# Patient Record
Sex: Female | Born: 1960 | Race: White | Hispanic: No | Marital: Single | Smoking: Current every day smoker
Health system: Southern US, Community
[De-identification: ages and names within clinical notes are randomized; demographics above are authoritative.]

---

## 2006-11-12 ENCOUNTER — Ambulatory Visit: Payer: Self-pay | Admitting: Family Medicine

## 2013-02-24 ENCOUNTER — Ambulatory Visit: Payer: Self-pay | Admitting: Family Medicine

## 2013-10-02 ENCOUNTER — Ambulatory Visit: Payer: Self-pay | Admitting: Orthopedic Surgery

## 2013-10-02 ENCOUNTER — Emergency Department: Payer: Self-pay | Admitting: Emergency Medicine
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
## 2014-12-22 ENCOUNTER — Other Ambulatory Visit: Payer: Self-pay | Admitting: Family Medicine

## 2014-12-22 DIAGNOSIS — Z1231 Encounter for screening mammogram for malignant neoplasm of breast: Secondary | ICD-10-CM

## 2015-01-01 ENCOUNTER — Ambulatory Visit: Payer: Self-pay

## 2016-06-13 ENCOUNTER — Other Ambulatory Visit: Payer: Self-pay | Admitting: Family Medicine

## 2016-06-13 DIAGNOSIS — Z1231 Encounter for screening mammogram for malignant neoplasm of breast: Secondary | ICD-10-CM

## 2016-07-08 ENCOUNTER — Ambulatory Visit: Payer: Self-pay | Attending: Family Medicine

## 2017-10-06 ENCOUNTER — Other Ambulatory Visit: Payer: Self-pay | Admitting: Family Medicine

## 2017-10-06 DIAGNOSIS — Z1231 Encounter for screening mammogram for malignant neoplasm of breast: Secondary | ICD-10-CM

## 2017-10-23 ENCOUNTER — Ambulatory Visit
Admission: RE | Admit: 2017-10-23 | Discharge: 2017-10-23 | Disposition: A | Discharge status: Home / Self Care | Payer: BLUE CROSS/BLUE SHIELD | Source: Ambulatory Visit | Attending: Family Medicine | Admitting: Family Medicine

## 2017-10-23 DIAGNOSIS — Z1231 Encounter for screening mammogram for malignant neoplasm of breast: Secondary | ICD-10-CM | POA: Diagnosis not present

## 2018-12-29 ENCOUNTER — Other Ambulatory Visit: Payer: Self-pay | Admitting: Family Medicine

## 2018-12-29 DIAGNOSIS — Z1231 Encounter for screening mammogram for malignant neoplasm of breast: Secondary | ICD-10-CM

## 2019-10-17 ENCOUNTER — Ambulatory Visit
Admission: RE | Admit: 2019-10-17 | Discharge: 2019-10-17 | Disposition: A | Discharge status: Home / Self Care | Payer: BC Managed Care – PPO | Source: Ambulatory Visit | Attending: Family Medicine | Admitting: Family Medicine

## 2019-10-17 ENCOUNTER — Other Ambulatory Visit: Payer: Self-pay

## 2019-10-17 DIAGNOSIS — Z1231 Encounter for screening mammogram for malignant neoplasm of breast: Secondary | ICD-10-CM | POA: Insufficient documentation

## 2021-07-29 ENCOUNTER — Other Ambulatory Visit: Payer: Self-pay | Admitting: Orthopedic Surgery

## 2021-07-29 DIAGNOSIS — G8929 Other chronic pain: Secondary | ICD-10-CM

## 2021-07-29 DIAGNOSIS — M5136 Other intervertebral disc degeneration, lumbar region: Secondary | ICD-10-CM

## 2021-08-12 ENCOUNTER — Ambulatory Visit
Admission: RE | Admit: 2021-08-12 | Discharge: 2021-08-12 | Disposition: A | Discharge status: Home / Self Care | Payer: PRIVATE HEALTH INSURANCE | Source: Ambulatory Visit | Attending: Orthopedic Surgery | Admitting: Orthopedic Surgery

## 2021-08-12 DIAGNOSIS — M5441 Lumbago with sciatica, right side: Secondary | ICD-10-CM | POA: Insufficient documentation

## 2021-08-12 DIAGNOSIS — G8929 Other chronic pain: Secondary | ICD-10-CM | POA: Insufficient documentation

## 2021-08-12 DIAGNOSIS — M5136 Other intervertebral disc degeneration, lumbar region: Secondary | ICD-10-CM | POA: Diagnosis present

## 2022-04-30 NOTE — Progress Notes (Unsigned)
Referring Physician:  Reche Dixon, PA-C [place],  [place] [address]  Primary Physician:  [facility], [place]  History of Present Illness: 05/01/2022 [name] is here today with a chief complaint of low back pain that radiates into the right hip and down the leg and stops at the mid calf along with numbness in the toes.  She began having pain approximately 1 year ago.  She lifted something at work and began having pain that went down her leg.  Her pain is currently 2-3 out of 10 but made worse when she walks, stands, bends, or lifts.  Medication has helped.  She has been on accommodation of meloxicam, tizanidine, and gabapentin with improvement.  She has done significant physical therapy without improvement.   Bowel/Bladder Dysfunction: none  Conservative measures:  Physical therapy: has participated in at [facility] for 6 weeks without relief around March 2023 Multimodal medical therapy including regular antiinflammatories: tylenol, gabapentin, meloxicam, tizanidine, tramadol Injections:  has received epidural steroid injections 09/13/21: Right L5-S1 TF ESI (no relief)  Past Surgery: denies  Amber Atkins has no symptoms of cervical myelopathy.  The symptoms are causing a significant impact on the patient's life.   I have utilized the care everywhere function in epic to review the outside records available from external health systems.  Review of Systems:  A 10 point review of systems is negative, except for the pertinent positives and negatives detailed in the HPI.  Past Medical History: No past medical history on file.  Past Surgical History: No past surgical history on file.  Allergies: Allergies as of 05/01/2022 - Review Complete 05/01/2022  Allergen Reaction Noted   Penicillins Other (See Comments) 10/10/2013    Medications: Current Meds  Medication Sig   acetaminophen (TYLENOL) 325 MG tablet Take 325 mg by mouth every 6  (six) hours as needed.   albuterol (VENTOLIN HFA) 108 (90 Base) MCG/ACT inhaler 2 puffs q.i.d. p.r.n. short of breath, wheezing, or cough   amLODipine (NORVASC) 5 MG tablet Take 5 mg by mouth daily.   ascorbic acid (VITAMIN C) 100 MG tablet Take 100 mg by mouth daily.   aspirin EC 81 MG tablet Take 81 mg by mouth daily.   atenolol (TENORMIN) 50 MG tablet Take 50 mg by mouth daily.   busPIRone (BUSPAR) 15 MG tablet Take 15 mg by mouth 2 (two) times daily.   fluticasone (FLONASE) 50 MCG/ACT nasal spray Place 1 spray into both nostrils daily.   gabapentin (NEURONTIN) 100 MG capsule 100 mg by mouth in the morning and 327m by mouth at night   hydrochlorothiazide (HYDRODIURIL) 25 MG tablet Take 25 mg by mouth every morning.   lisinopril (ZESTRIL) 40 MG tablet Take 40 mg by mouth daily.   MAGNESIUM PO Take 1 tablet by mouth daily as needed.   meloxicam (MOBIC) 15 MG tablet Take 15 mg by mouth daily.   Multiple Vitamin (ONE DAILY) tablet Take 1 tablet by mouth daily.   tiZANidine (ZANAFLEX) 2 MG tablet Take 2 mg by mouth at bedtime.    Social History: Social History   Tobacco Use   Smoking status: Every Day    Types: Cigarettes   Smokeless tobacco: Never    Family Medical History: Family History  Problem Relation Age of Onset   Breast cancer Maternal Aunt 758   Physical Examination: Vitals:   05/01/22 1504  BP: (!) 142/80    General: Patient is well developed, well  nourished, calm, collected, and in no apparent distress. Attention to examination is appropriate.  Neck:   Supple.  Full range of motion.  Respiratory: Patient is breathing without any difficulty.   NEUROLOGICAL:     Awake, alert, oriented to person, place, and time.  Speech is clear and fluent.   Cranial Nerves: Pupils equal round and reactive to light.  Facial tone is symmetric.  Facial sensation is symmetric. Shoulder shrug is symmetric. Tongue protrusion is midline.  There is no pronator drift.  ROM of  spine: full.    Strength: Side Biceps Triceps Deltoid Interossei Grip Wrist Ext. Wrist Flex.  R 5 5 5 5 5 5 5  $ L 5 5 5 5 5 5 5   $ Side Iliopsoas Quads Hamstring PF DF EHL  R 5 5 5 5 5 5  $ L 5 5 5 5 5 5   $ Reflexes are 1+ and symmetric at the biceps, triceps, brachioradialis, patella and achilles.   Hoffman's is absent.   Bilateral upper and lower extremity sensation is intact to light touch.    No evidence of dysmetria noted.  Gait is normal.     Medical Decision Making  Imaging: MRI L spine 08/12/2021  Vertebrae: No fracture, evidence of discitis, or bone lesion. Discogenic endplate edema at 075-GRM.   Conus medullaris and cauda equina: Conus extends to the T12-L1 level. Conus and cauda equina appear normal.   Paraspinal and other soft tissues: Posterior synovial cyst/degenerative ganglion at the right L4-5 facet. Asymmetric left renal atrophy and cortical cysts.   Disc levels:   T12- L1: Unremarkable.   L1-L2: Disc narrowing and bulging.   L2-L3: Disc narrowing and bulging with mild facet spurring   L3-L4: Disc narrowing and foraminal predominant bulging   L4-L5: Disc narrowing and bulging with annular fissure. Bilateral facet spurring. Moderate spinal stenosis. Either L5 nerve root could be affected in the subarticular recesses. Mild bilateral foraminal narrowing.   L5-S1:Mild disc bulging and facet spurring.  No neural compression   IMPRESSION: 1. 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 2. Asymmetric left renal atrophy.     Electronically Signed   By: Jorje Guild M.D.   On: 08/13/2021 13:01  I have personally reviewed the images and agree with the above interpretation.  Assessment and Plan: Amber Atkins is a pleasant 62 y.o. female with lumbar radiculopathy due to lateral recess compression of the right L5 nerve root at L4-5.  She also has lateral recess compression of the  left side at L4-5.  However, her symptoms are entirely right-sided.  She has tried and failed conservative management including physical therapy, medications, and injections.  At this point, it would be reasonable to consider L4-5 decompression.  I discussed the planned procedure at length with the patient, including the risks, benefits, alternatives, and indications. The risks discussed include but are not limited to bleeding, infection, need for reoperation, spinal fluid leak, stroke, vision loss, anesthetic complication, coma, paralysis, and even death. I also described in detail that improvement was not guaranteed.  The patient expressed understanding of these risks, and asked that we proceed with surgery. I described the surgery in layman's terms, and gave ample opportunity for questions, which were answered to the best of my ability.   She would like to think this over.  I will see her back in 4 weeks to discuss.  Thank you for involving me in the care of this patient.  Ario Mcdiarmid K. Izora Ribas MD, [facility] Neurosurgery

## 2022-05-01 ENCOUNTER — Ambulatory Visit (INDEPENDENT_AMBULATORY_CARE_PROVIDER_SITE_OTHER): Payer: Worker's Compensation | Admitting: Neurosurgery

## 2022-05-01 ENCOUNTER — Encounter: Payer: Self-pay | Admitting: Neurosurgery

## 2022-05-01 VITALS — BP 142/80 | Ht 67.0 in | Wt 132.8 lb

## 2022-05-01 DIAGNOSIS — M5416 Radiculopathy, lumbar region: Secondary | ICD-10-CM

## 2022-05-01 MED ORDER — GABAPENTIN 100 MG PO CAPS: ORAL_CAPSULE | ORAL | 0 refills | Status: DC

## 2022-05-01 MED ORDER — TIZANIDINE HCL 2 MG PO TABS: 2.0000 mg | ORAL_TABLET | Freq: Four times a day (QID) | ORAL | 0 refills | Status: DC | PRN

## 2022-05-01 MED ORDER — MELOXICAM 15 MG PO TABS: 15.0000 mg | ORAL_TABLET | Freq: Every day | ORAL | 0 refills | Status: DC

## 2022-05-29 ENCOUNTER — Encounter: Payer: Self-pay | Admitting: Neurosurgery

## 2022-05-29 ENCOUNTER — Ambulatory Visit: Payer: Worker's Compensation | Admitting: Neurosurgery

## 2022-05-29 ENCOUNTER — Ambulatory Visit (INDEPENDENT_AMBULATORY_CARE_PROVIDER_SITE_OTHER): Payer: PRIVATE HEALTH INSURANCE | Admitting: Neurosurgery

## 2022-05-29 VITALS — BP 133/80 | HR 75 | Ht 67.0 in | Wt 132.0 lb

## 2022-05-29 DIAGNOSIS — M5416 Radiculopathy, lumbar region: Secondary | ICD-10-CM

## 2022-05-29 MED ORDER — MELOXICAM 15 MG PO TABS: 15.0000 mg | ORAL_TABLET | Freq: Every day | ORAL | 0 refills | Status: AC

## 2022-05-29 MED ORDER — GABAPENTIN 100 MG PO CAPS: ORAL_CAPSULE | ORAL | 0 refills | Status: AC

## 2022-05-29 MED ORDER — TIZANIDINE HCL 2 MG PO TABS: 2.0000 mg | ORAL_TABLET | Freq: Four times a day (QID) | ORAL | 0 refills | Status: AC | PRN

## 2022-05-29 NOTE — Progress Notes (Signed)
Referring Physician:  [facility] [place],  [place] [address]  Primary Physician:  [facility] [place]  History of Present Illness: 05/29/2022 Amber Atkins returns to see me.  She has continued pain down her legs.  She remains concerned about considering surgical intervention.  She has been working but has had to modify her activity at work.  05/01/2022 Amber Atkins is here today with a chief complaint of low back pain that radiates into the right hip and down the leg and stops at the mid calf along with numbness in the toes.  She began having pain approximately 1 year ago.  She lifted something at work and began having pain that went down her leg.  Her pain is currently 2-3 out of 10 but made worse when she walks, stands, bends, or lifts.  Medication has helped.  She has been on accommodation of meloxicam, tizanidine, and gabapentin with improvement.  She has done significant physical therapy without improvement.   Bowel/Bladder Dysfunction: none  Conservative measures:  Physical therapy: has participated in at [facility] for 6 weeks without relief around March 2023 Multimodal medical therapy including regular antiinflammatories: tylenol, gabapentin, meloxicam, tizanidine, tramadol Injections:  has received epidural steroid injections 09/13/21: Right L5-S1 TF ESI (no relief)  Past Surgery: denies  Amber Atkins has no symptoms of cervical myelopathy.  The symptoms are causing a significant impact on the patient's life.   I have utilized the care everywhere function in epic to review the outside records available from external health systems.  Review of Systems:  A 10 point review of systems is negative, except for the pertinent positives and negatives detailed in the HPI.  Past Medical History: No past medical history on file.  Past Surgical History: No past surgical history on file.  Allergies: Allergies  as of 05/29/2022 - Review Complete 05/29/2022  Allergen Reaction Noted   Penicillins Other (See Comments) 10/10/2013    Medications: Current Meds  Medication Sig   acetaminophen (TYLENOL) 325 MG tablet Take 325 mg by mouth every 6 (six) hours as needed.   albuterol (VENTOLIN HFA) 108 (90 Base) MCG/ACT inhaler 2 puffs q.i.d. p.r.n. short of breath, wheezing, or cough   amLODipine (NORVASC) 5 MG tablet Take 5 mg by mouth daily.   ascorbic acid (VITAMIN C) 100 MG tablet Take 100 mg by mouth daily.   aspirin EC 81 MG tablet Take 81 mg by mouth daily.   atenolol (TENORMIN) 50 MG tablet Take 50 mg by mouth daily.   busPIRone (BUSPAR) 15 MG tablet Take 15 mg by mouth 2 (two) times daily.   fluticasone (FLONASE) 50 MCG/ACT nasal spray Place 1 spray into both nostrils daily.   gabapentin (NEURONTIN) 100 MG capsule Take 1 capsule (100 mg total) by mouth daily AND 3 capsules (300 mg total) at bedtime.   hydrochlorothiazide (HYDRODIURIL) 25 MG tablet Take 25 mg by mouth every morning.   lisinopril (ZESTRIL) 40 MG tablet Take 40 mg by mouth daily.   MAGNESIUM PO Take 1 tablet by mouth daily as needed.   meloxicam (MOBIC) 15 MG tablet Take 1 tablet (15 mg total) by mouth daily.   Multiple Vitamin (ONE DAILY) tablet Take 1 tablet by mouth daily.   tiZANidine (ZANAFLEX) 2 MG tablet Take 1 tablet (2 mg total) by mouth every 6 (six) hours as needed for muscle spasms.    Social History: Social History   Tobacco Use  Smoking status: Every Day    Types: Cigarettes   Smokeless tobacco: Never    Family Medical History: Family History  Problem Relation Age of Onset   Breast cancer Maternal Aunt 70    Physical Examination: Vitals:   05/29/22 1427  BP: 133/80  Pulse: 75  SpO2: 95%    General: Patient is well developed, well nourished, calm, collected, and in no apparent distress. Attention to examination is appropriate.  Neck:   Supple.  Full range of motion.  Respiratory: Patient is  breathing without any difficulty.   NEUROLOGICAL:     Awake, alert, oriented to person, place, and time.  Speech is clear and fluent.   Cranial Nerves: Pupils equal round and reactive to light.  Facial tone is symmetric.  Facial sensation is symmetric. Shoulder shrug is symmetric. Tongue protrusion is midline.  There is no pronator drift.  ROM of spine: full.    Strength: Side Biceps Triceps Deltoid Interossei Grip Wrist Ext. Wrist Flex.  R 5 5 5 5 5 5 5   L 5 5 5 5 5 5 5    Side Iliopsoas Quads Hamstring PF DF EHL  R 5 5 5 5 5 5   L 5 5 5 5 5 5    Reflexes are 1+ and symmetric at the biceps, triceps, brachioradialis, patella and achilles.   Hoffman's is absent.   Bilateral upper and lower extremity sensation is intact to light touch.    No evidence of dysmetria noted.  Gait is normal.     Medical Decision Making  Imaging: MRI L spine 08/12/2021  Vertebrae: No fracture, evidence of discitis, or bone lesion. Discogenic endplate edema at 075-GRM.   Conus medullaris and cauda equina: Conus extends to the T12-L1 level. Conus and cauda equina appear normal.   Paraspinal and other soft tissues: Posterior synovial cyst/degenerative ganglion at the right L4-5 facet. Asymmetric left renal atrophy and cortical cysts.   Disc levels:   T12- L1: Unremarkable.   L1-L2: Disc narrowing and bulging.   L2-L3: Disc narrowing and bulging with mild facet spurring   L3-L4: Disc narrowing and foraminal predominant bulging   L4-L5: Disc narrowing and bulging with annular fissure. Bilateral facet spurring. Moderate spinal stenosis. Either L5 nerve root could be affected in the subarticular recesses. Mild bilateral foraminal narrowing.   L5-S1:Mild disc bulging and facet spurring.  No neural compression   IMPRESSION: 1. 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 2. Asymmetric left renal atrophy.      Electronically Signed   By: Jorje Guild M.D.   On: 08/13/2021 13:01  I have personally reviewed the images and agree with the above interpretation.  Assessment and Atkins: Amber Atkins is a pleasant 62 y.o. female with lumbar radiculopathy due to lateral recess compression of the right L5 nerve root at L4-5.  She also has lateral recess compression of the left side at L4-5.  However, her symptoms are entirely right-sided.  She has tried and failed conservative management including physical therapy, medications, and injections.  At this point, it would be reasonable to consider L4-5 decompression.  She is very worried about surgical intervention.  She would like to hold off for now.  I will refill her medications.  We did discuss that she may be released by [employer] at some point.  If so, that may complicate her care as she is currently insured by [employer].  Additionally, she is not far from the  63-month window of physical therapy.  She will let me know if she would like to move forward with surgery.  At this point, I will see her back on an as-needed basis.Thank you for involving me in the care of this patient.      Eulises Kijowski K. Izora Ribas MD, [facility] Neurosurgery

## 2023-03-13 ENCOUNTER — Other Ambulatory Visit: Payer: Self-pay | Admitting: Orthopedic Surgery

## 2023-03-13 DIAGNOSIS — M545 Low back pain, unspecified: Secondary | ICD-10-CM

## 2023-03-13 DIAGNOSIS — M542 Cervicalgia: Secondary | ICD-10-CM

## 2023-03-17 ENCOUNTER — Other Ambulatory Visit: Payer: Self-pay | Admitting: Orthopedic Surgery

## 2023-03-17 DIAGNOSIS — M542 Cervicalgia: Secondary | ICD-10-CM

## 2023-03-17 DIAGNOSIS — M545 Low back pain, unspecified: Secondary | ICD-10-CM

## 2023-04-09 IMAGING — MR MR LUMBAR SPINE W/O CM
5 series · 31 of 48 positions shown · non-contrast
Comparison: None Available.

CLINICAL DATA: Lifting injury April 2021. Right-sided low back
pain radiating to the right leg since injury.

EXAM:
MRI LUMBAR SPINE WITHOUT CONTRAST
TECHNIQUE: Multiplanar, multisequence MR imaging of the lumbar spine was
performed. No intravenous contrast was administered.

[Series 5: T2 · sagittal · 4.0mm · 0.81mm/px · 6 of 17 slices shown (1 of 2)]
[im 1/17]
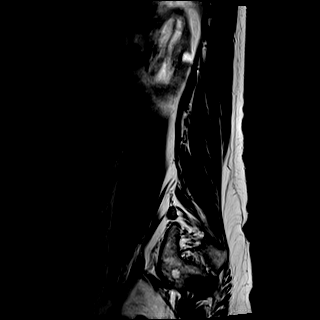
[im 4/17]
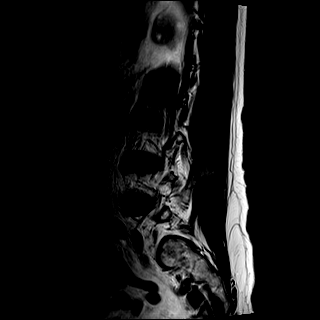
[im 7/17]
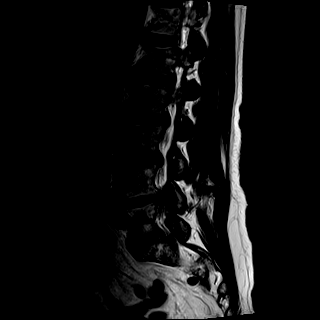
[im 10/17]
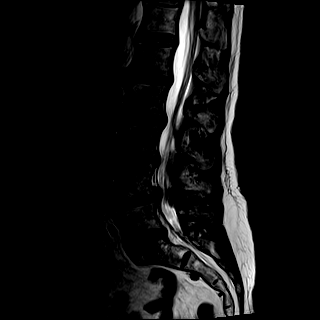
[im 13/17]
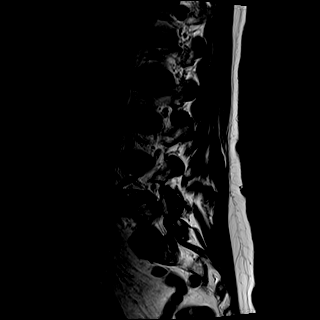
[im 17/17]
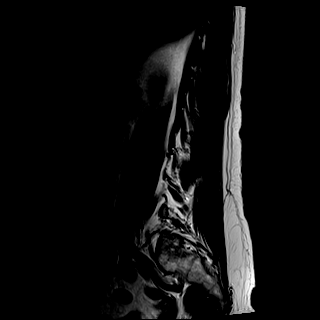

[Series 6: T1 · sagittal · 4.0mm · 0.81mm/px · 7 of 17 slices shown (1 of 2)]
[im 1/17]
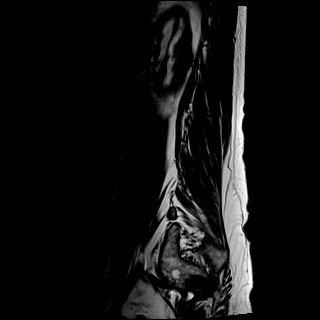
[im 3/17]
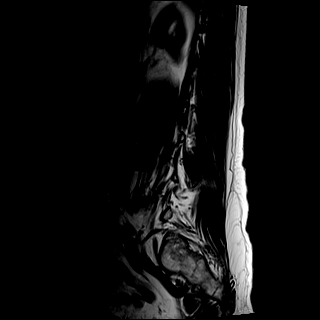
[im 6/17]
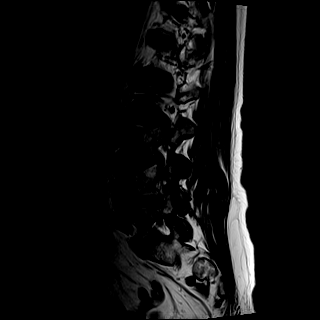
[im 9/17]
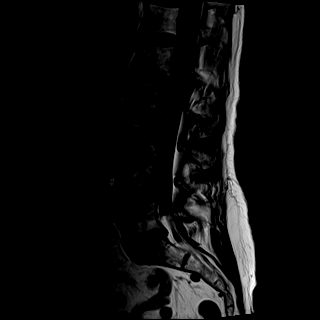
[im 11/17]
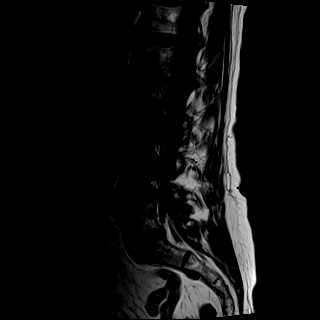
[im 14/17]
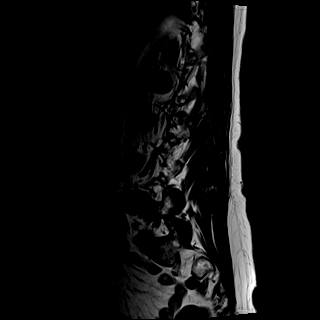
[im 17/17]
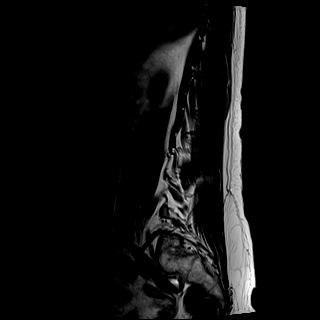

[Series 7: STIR · sagittal · 4.0mm · 0.41mm/px · 2 of 17 slices shown]
[im 1/17]
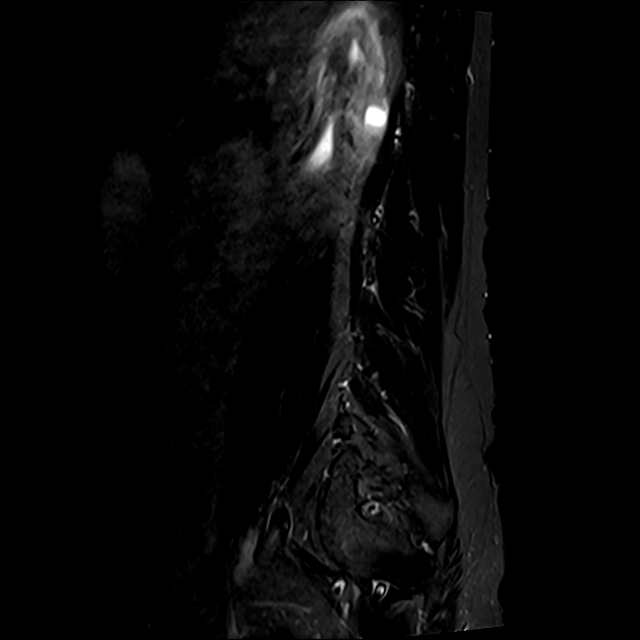
[im 3/17]
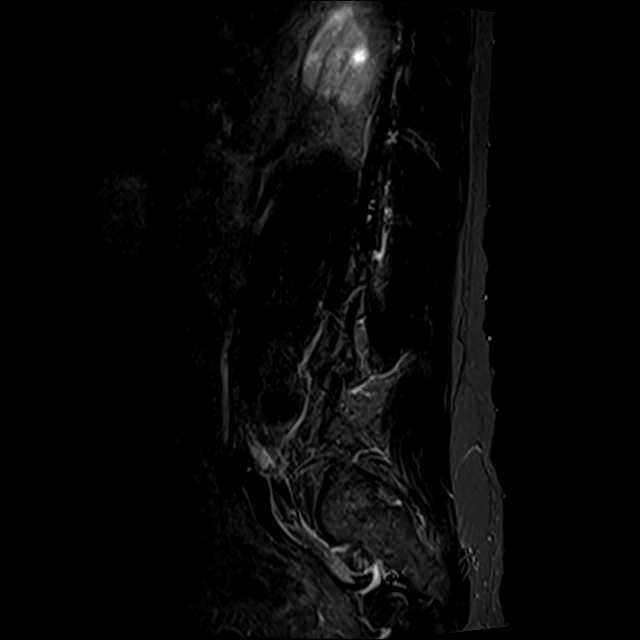

[Series 8: T2 · axial · 4.0mm · 0.78mm/px · z∈[-94,+117]mm · 8 of 36 slices shown (2 of 2)]
[im 1/36]
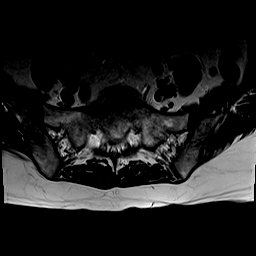
[im 6/36]
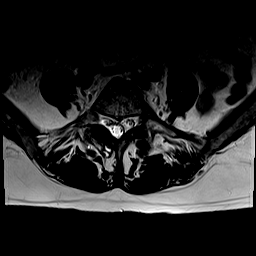
[im 11/36]
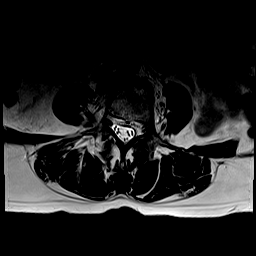
[im 17/36]
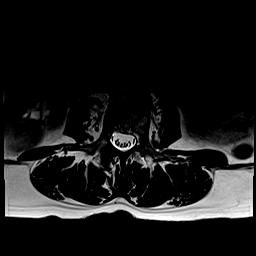
[im 19/36]
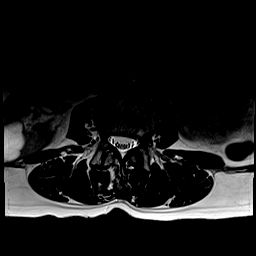
[im 25/36]
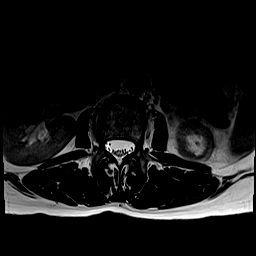
[im 30/36]
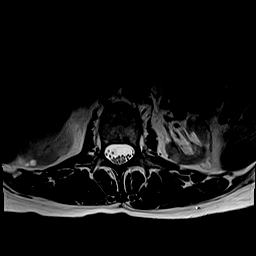
[im 36/36]
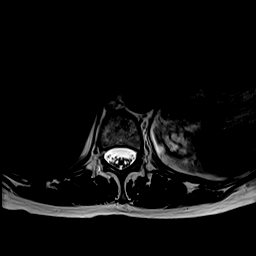

[Series 9: T1 · axial · 4.0mm · 0.39mm/px · z∈[-94,+117]mm · 8 of 36 slices shown (2 of 2)]
[im 1/36]
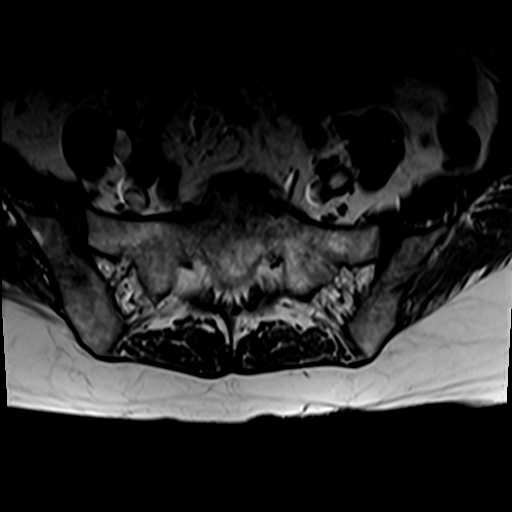
[im 6/36]
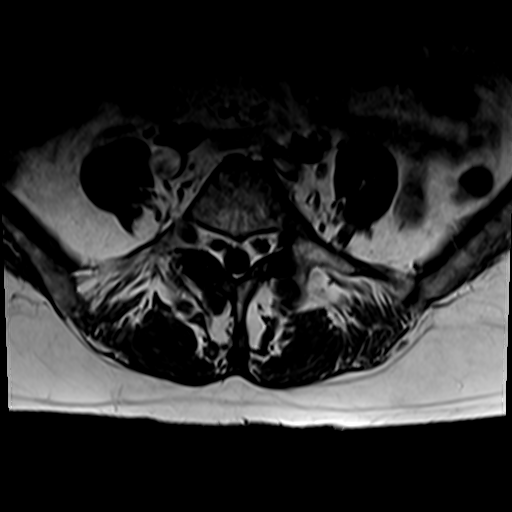
[im 11/36]
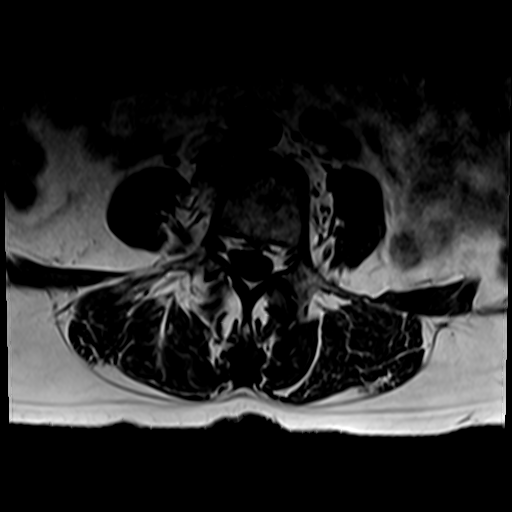
[im 17/36]
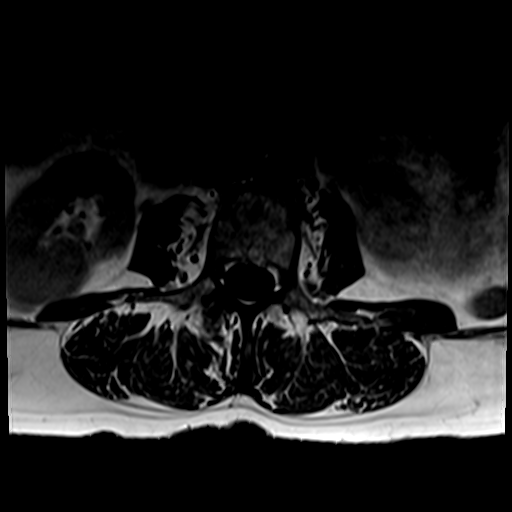
[im 19/36]
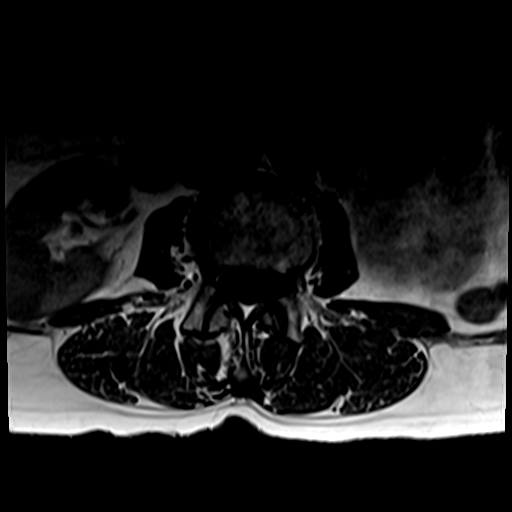
[im 25/36]
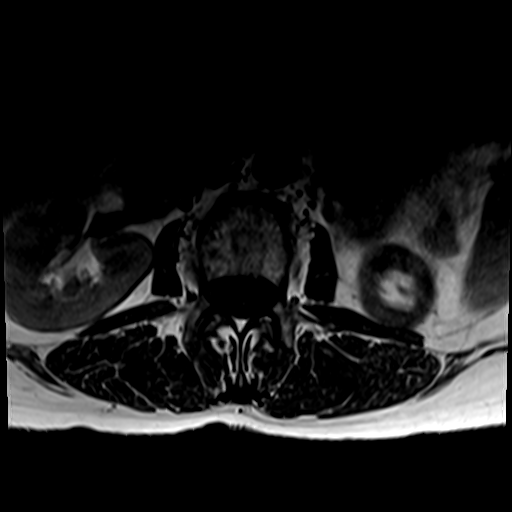
[im 30/36]
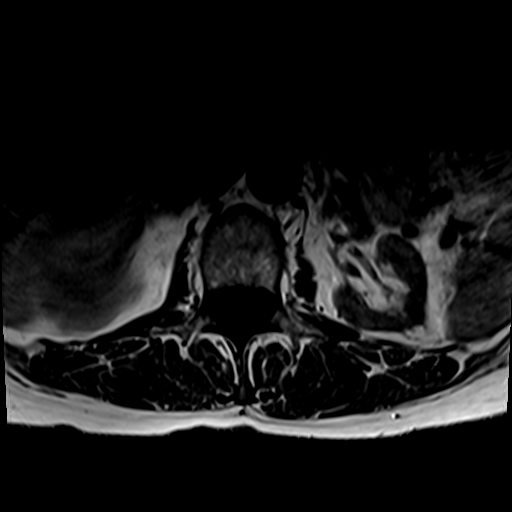
[im 36/36]
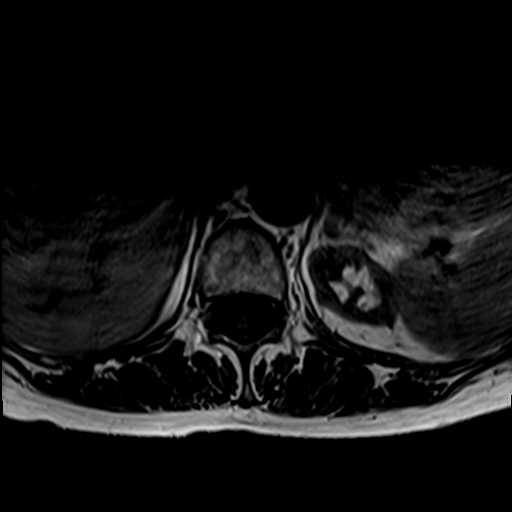

[31 of 48 positions shown; findings below may reference images not displayed]

FINDINGS: Segmentation:  5 lumbar type vertebrae

Alignment:  Mild levocurvature.

Vertebrae: No fracture, evidence of discitis, or bone lesion.
Discogenic endplate edema at L5-S1.

Conus medullaris and cauda equina: Conus extends to the T12-L1
level. Conus and cauda equina appear normal.

Paraspinal and other soft tissues: Posterior synovial
cyst/degenerative ganglion at the right L4-5 facet. Asymmetric left
renal atrophy and cortical cysts.

Disc levels:

T12- L1: Unremarkable.

L1-L2: Disc narrowing and bulging.

L2-L3: Disc narrowing and bulging with mild facet spurring

L3-L4: Disc narrowing and foraminal predominant bulging

L4-L5: Disc narrowing and bulging with annular fissure. Bilateral
facet spurring. Moderate spinal stenosis. Either L5 nerve root could
be affected in the subarticular recesses. Mild bilateral foraminal
narrowing.

L5-S1:Mild disc bulging and facet spurring.  No neural compression
IMPRESSION: 1. Multilevel lumbar spine degeneration greatest at L4-5 where there
is discogenic endplate edema and moderate spinal stenosis.
2. Asymmetric left renal atrophy.

## 2023-08-05 ENCOUNTER — Other Ambulatory Visit: Payer: Self-pay | Admitting: Family Medicine

## 2023-08-05 DIAGNOSIS — Z1231 Encounter for screening mammogram for malignant neoplasm of breast: Secondary | ICD-10-CM
